# Patient Record
Sex: Female | Born: 2000 | Race: White | Hispanic: No | Marital: Single | Smoking: Never smoker
Health system: Southern US, Community
[De-identification: ages and names within clinical notes are randomized; demographics above are authoritative.]

## PROBLEM LIST (undated history)

## (undated) DIAGNOSIS — N83209 Unspecified ovarian cyst, unspecified side: Secondary | ICD-10-CM

## (undated) DIAGNOSIS — Z789 Other specified health status: Secondary | ICD-10-CM

## (undated) DIAGNOSIS — D1809 Hemangioma of other sites: Secondary | ICD-10-CM

## (undated) HISTORY — PX: NO PAST SURGERIES: SHX2092

## (undated) HISTORY — DX: Other specified health status: Z78.9

## (undated) HISTORY — DX: Unspecified ovarian cyst, unspecified side: N83.209

---
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
## 2022-03-11 NOTE — Progress Notes (Signed)
Carlean Jews, NP   Chief Complaint  Patient presents with   IUD check    No concerns   Urinary Tract Infection    Urinary frequency and burning    HPI:      [name] is a 22 y.o. G0P0000 whose LMP was No LMP recorded. (Menstrual status: IUD)., presents today for IUD check, STD testing. Pt also with UTI sx. Kyleena placed 09/17/21, doing well. Has infrequent spotting with IUD. No dysmen. Doing well.  Pt is sexually active, no pain/bleeding. Neg STD testing 2/23. Pt has noticed urgency, dysuria and frequency with good flow for the past wk. No LBP, hematuria, pelvic pain, vag sx, fevers. Hx of UTIs in past, last one about a yr ago. Has had fatigue and increased appetite with UTI sx, wants preg test.   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 09/01/2020   Encounter for contraceptive management 09/01/2020    Past Surgical History:  Procedure Laterality Date   NO PAST SURGERIES      Family History  Problem Relation Age of Onset   Breast cancer Maternal Aunt 34   Colon cancer Paternal Aunt        18   Colon cancer Paternal Grandmother 20    Social History   Socioeconomic History   Marital status: Single    Spouse name: Not on file   Number of children: Not on file   Years of education: Not on file   Highest education level: Not on file  Occupational History   Not on file  Tobacco Use   Smoking status: Never   Smokeless tobacco: Never  Vaping Use   Vaping Use: Never used  Substance and Sexual Activity   Alcohol use: Never   Drug use: Never   Sexual activity: Yes    Birth control/protection: I.U.D.    Comment: Kyleena  Other Topics Concern   Not on file  Social History Narrative   Not on file   Social Determinants of Health    Financial Resource Strain: Not on file  Food Insecurity: Not on file  Transportation Needs: Not on file  Physical Activity: Not on file  Stress: Not on file  Social Connections: Not on file  Intimate Partner Violence: Not on file    Outpatient Medications Prior to Visit  Medication Sig Dispense Refill   ELIQUIS 2.5 MG TABS tablet Take 2.5 mg by mouth 2 (two) times daily.     gabapentin (NEURONTIN) 300 MG capsule      levonorgestrel (KYLEENA) 19.5 MG IUD 1 each by Intrauterine route once.     albuterol (VENTOLIN HFA) 108 (90 Base) MCG/ACT inhaler Inhale 2 puffs into the lungs every 6 (six) hours as needed for wheezing or shortness of breath. 8 g 2   azithromycin (ZITHROMAX Z-PAK) 250 MG tablet 2 pills today then 1 pill a day for 4 days 6 each 0   brompheniramine-pseudoephedrine-DM 30-2-10 MG/5ML syrup Take 5 mLs by mouth 4 (four) times daily as needed. 250 mL 0   norethindrone (ORTHO MICRONOR) 0.35 MG tablet Take 1 tablet (0.35 mg total) by mouth daily. (Patient not taking: Reported on 09/17/2021) 28 tablet 11   No facility-administered medications prior to visit.      ROS:  Review of Systems  Constitutional:  Negative for fever.  Gastrointestinal:  Negative for blood in stool, constipation, diarrhea, nausea and vomiting.  Genitourinary:  Positive for dysuria, frequency and urgency. Negative for dyspareunia, flank pain, hematuria, vaginal bleeding, vaginal discharge and vaginal pain.  Musculoskeletal:  Negative for back pain.  Skin:  Negative for rash.   BREAST: No symptoms   OBJECTIVE:   Vitals:  BP 110/60   Ht 5\' 7"  (1.702 m)   Wt 128 lb (58.1 kg)   BMI 20.05 kg/m   Physical Exam Vitals reviewed.  Constitutional:      Appearance: She is well-developed.  Pulmonary:     Effort: Pulmonary effort is normal.  Genitourinary:    General: Normal vulva.     Pubic Area: No rash.      Labia:        Right: No rash, tenderness or lesion.        Left: No rash, tenderness  or lesion.      Vagina: Normal. No vaginal discharge, erythema, tenderness or bleeding.     Cervix: Normal.     Uterus: Normal. Not enlarged and not tender.      Adnexa: Right adnexa normal and left adnexa normal.       Right: No mass or tenderness.         Left: No mass or tenderness.       Comments: IUD STRINGS IN CX OS Musculoskeletal:        General: Normal range of motion.     Cervical back: Normal range of motion.  Skin:    General: Skin is warm and dry.  Neurological:     General: No focal deficit present.     Mental Status: She is alert and oriented to person, place, and time.  Psychiatric:        Mood and Affect: Mood normal.        Behavior: Behavior normal.        Thought Content: Thought content normal.        Judgment: Judgment normal.     Results: Results for orders placed or performed in visit on 03/14/22 (from the past 24 hour(s))  POCT urine pregnancy     Status: Normal   Collection Time: 03/14/22 12:39 PM  Result Value Ref Range   Preg Test, Ur Negative Negative  POCT Urinalysis Dipstick     Status: Abnormal   Collection Time: 03/14/22 12:40 PM  Result Value Ref Range   Color, UA amber    Clarity, UA cloudy    Glucose, UA Negative Negative   Bilirubin, UA neg    Ketones, UA neg    Spec Grav, UA 1.025 1.010 - 1.025   Blood, UA small    pH, UA 6.0 5.0 - 8.0   Protein, UA Negative Negative   Urobilinogen, UA     Nitrite, UA neg    Leukocytes, UA Moderate (2+) (A) Negative   Appearance     Odor       Assessment/Plan: Acute cystitis with hematuria - Plan: nitrofurantoin, macrocrystal-monohydrate, (MACROBID) 100 MG capsule, POCT Urinalysis Dipstick, Urine Culture; pos sx and UA. Rx macrobid, check C&S. F/u prn.   Encounter for routine checking of intrauterine contraceptive device (IUD)--IUD strings in cx os, has 5 yr indication  Screening for STD (sexually transmitted disease) - Plan: Cervicovaginal ancillary only  Amenorrhea - Plan: POCT urine  pregnancy; neg UPT. Reassurance. F/u prn.    Meds ordered this encounter  Medications  nitrofurantoin, macrocrystal-monohydrate, (MACROBID) 100 MG capsule    Sig: Take 1 capsule (100 mg total) by mouth 2 (two) times daily for 5 days.    Dispense:  10 capsule    Refill:  0    Order Specific Question:   Supervising Provider    Answer:   Hildred Laser [[id number]]      Return in about 1 year (around 03/15/2023) for annual.  Burney Calzadilla B. Linlee Cromie, PA-C 03/14/2022 12:41 PM

## 2022-03-14 ENCOUNTER — Encounter: Payer: Self-pay | Admitting: Obstetrics and Gynecology

## 2022-03-14 ENCOUNTER — Ambulatory Visit (INDEPENDENT_AMBULATORY_CARE_PROVIDER_SITE_OTHER): Admitting: Obstetrics and Gynecology

## 2022-03-14 ENCOUNTER — Other Ambulatory Visit (HOSPITAL_COMMUNITY)
Admission: RE | Admit: 2022-03-14 | Discharge: 2022-03-14 | Disposition: A | Discharge status: Home / Self Care | Source: Ambulatory Visit | Attending: Obstetrics and Gynecology | Admitting: Obstetrics and Gynecology

## 2022-03-14 VITALS — BP 110/60 | Ht 67.0 in | Wt 128.0 lb

## 2022-03-14 DIAGNOSIS — N3001 Acute cystitis with hematuria: Secondary | ICD-10-CM

## 2022-03-14 DIAGNOSIS — N912 Amenorrhea, unspecified: Secondary | ICD-10-CM

## 2022-03-14 DIAGNOSIS — Z30431 Encounter for routine checking of intrauterine contraceptive device: Secondary | ICD-10-CM

## 2022-03-14 DIAGNOSIS — Z3202 Encounter for pregnancy test, result negative: Secondary | ICD-10-CM | POA: Diagnosis not present

## 2022-03-14 DIAGNOSIS — Z113 Encounter for screening for infections with a predominantly sexual mode of transmission: Secondary | ICD-10-CM | POA: Diagnosis present

## 2022-03-14 LAB — POCT URINALYSIS DIPSTICK
Bilirubin, UA: NEGATIVE
Glucose, UA: NEGATIVE
Ketones, UA: NEGATIVE
Nitrite, UA: NEGATIVE
Protein, UA: NEGATIVE
Spec Grav, UA: 1.025 (ref 1.010–1.025)
pH, UA: 6 (ref 5.0–8.0)

## 2022-03-14 LAB — POCT URINE PREGNANCY: Preg Test, Ur: NEGATIVE

## 2022-03-14 MED ORDER — NITROFURANTOIN MONOHYD MACRO 100 MG PO CAPS: 100.0000 mg | ORAL_CAPSULE | Freq: Two times a day (BID) | ORAL | 0 refills | Status: AC

## 2022-03-14 NOTE — Patient Instructions (Signed)
I value your feedback and you entrusting us with your care. If you get a [facility] patient survey, I would appreciate you taking the time to let us know about your experience today. Thank you! ? ? ?

## 2022-03-15 LAB — CERVICOVAGINAL ANCILLARY ONLY
Chlamydia: NEGATIVE
Comment: NEGATIVE
Comment: NORMAL
Neisseria Gonorrhea: NEGATIVE

## 2022-03-16 LAB — URINE CULTURE

## 2023-09-29 IMAGING — MR MR LUMBAR SPINE WO/W CM
5 of 7 series · 33 of 48 positions shown · IV contrast (gadavist)
Comparison: None

CLINICAL DATA: Low back and bilateral lower extremity pain and
numbness. History of hemangioma in the lumbar area.

EXAM:
MRI LUMBAR SPINE WITHOUT AND WITH CONTRAST
TECHNIQUE: Multiplanar and multiecho pulse sequences of the lumbar spine were
obtained without and with intravenous contrast.
CONTRAST:  6mL GADAVIST GADOBUTROL 1 MMOL/ML IV SOLN

[Series 5: T2 · sagittal · 4.0mm · 0.81mm/px · 3 of 17 slices shown (1 of 2)]
[im 1/17]
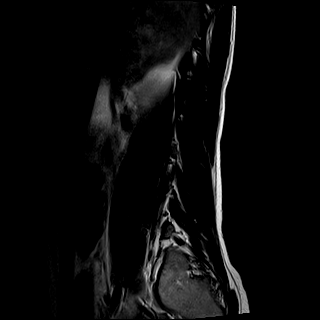
[im 9/17]
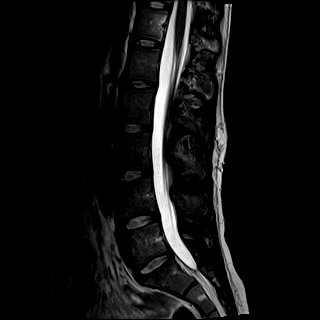
[im 17/17]
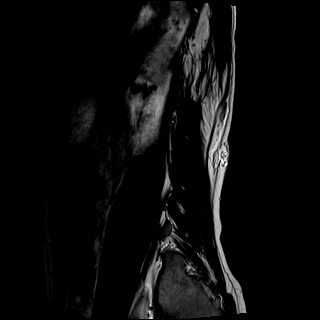

[Series 6: T1 · sagittal · 4.0mm · 0.81mm/px · 4 of 17 slices shown (1 of 2)]
[im 1/17]
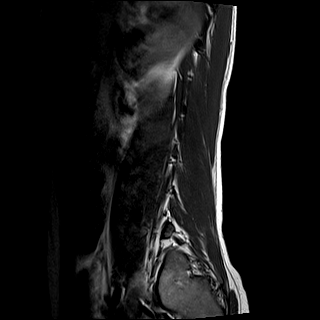
[im 6/17]
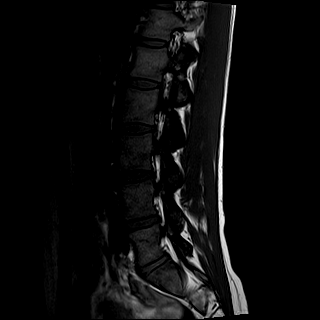
[im 11/17]
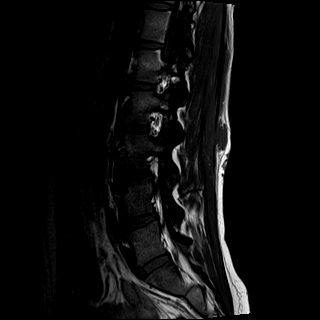
[im 17/17]
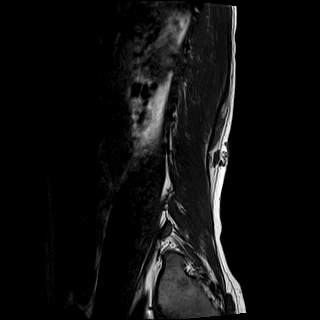

[Series 8: T2 · axial · 4.0mm · 0.78mm/px · z∈[-129,+134]mm · 11 of 45 slices shown (2 of 2)]
[im 1/45]
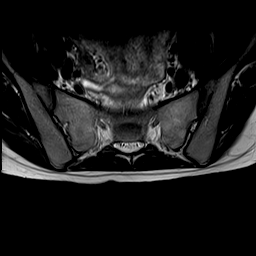
[im 5/45]
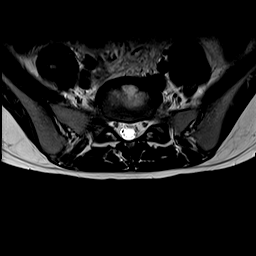
[im 9/45]
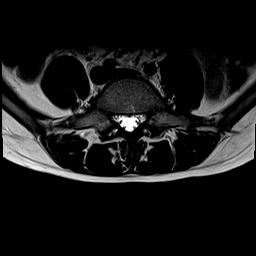
[im 14/45]
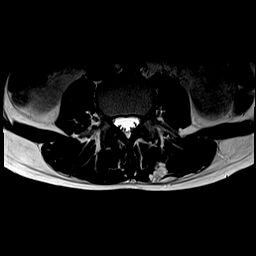
[im 18/45]
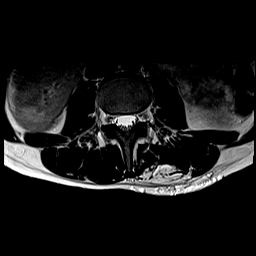
[im 23/45]
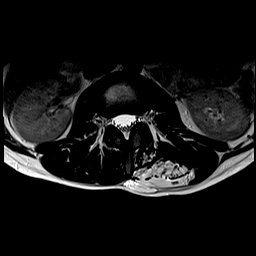
[im 27/45]
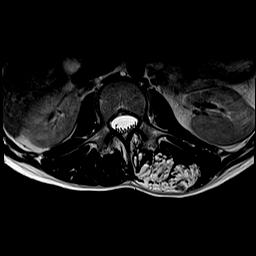
[im 31/45]
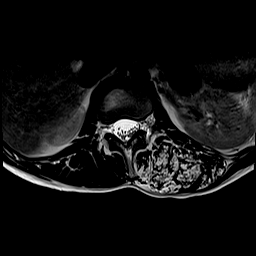
[im 36/45]
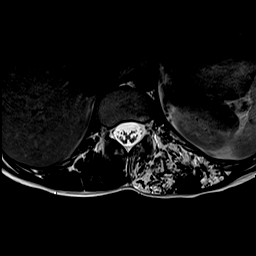
[im 40/45]
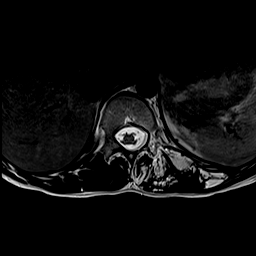
[im 45/45]
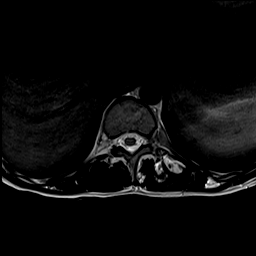

[Series 9: T1 · axial · 4.0mm · 0.39mm/px · z∈[-129,+134]mm · 11 of 45 slices shown (2 of 2)]
[im 1/45]
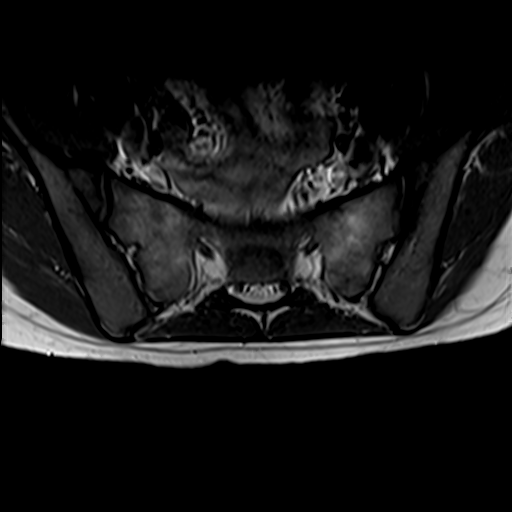
[im 5/45]
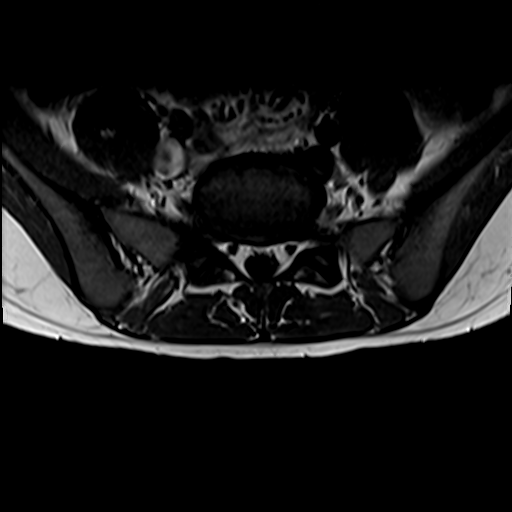
[im 9/45]
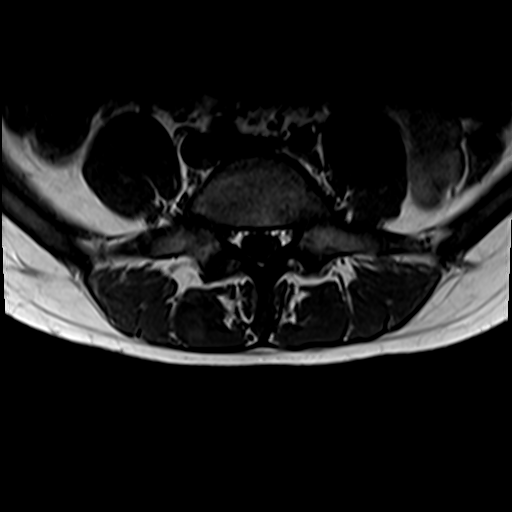
[im 14/45]
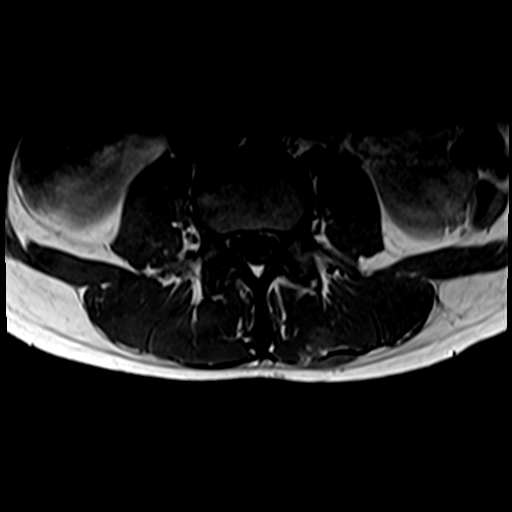
[im 18/45]
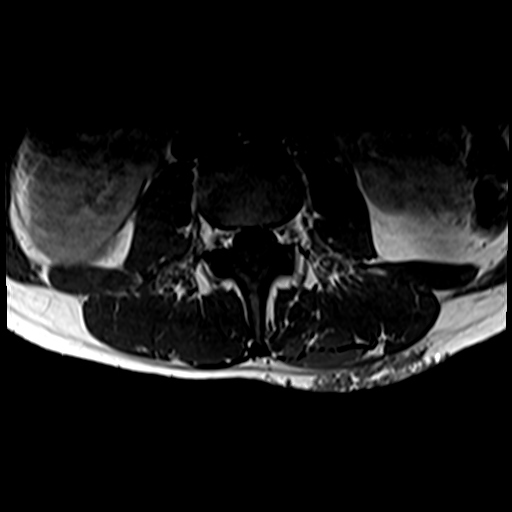
[im 23/45]
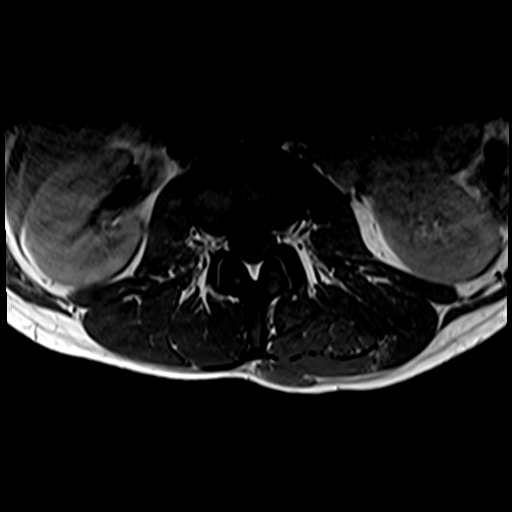
[im 27/45]
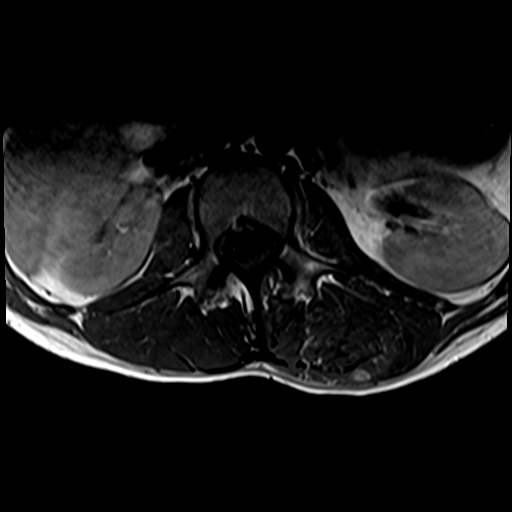
[im 31/45]
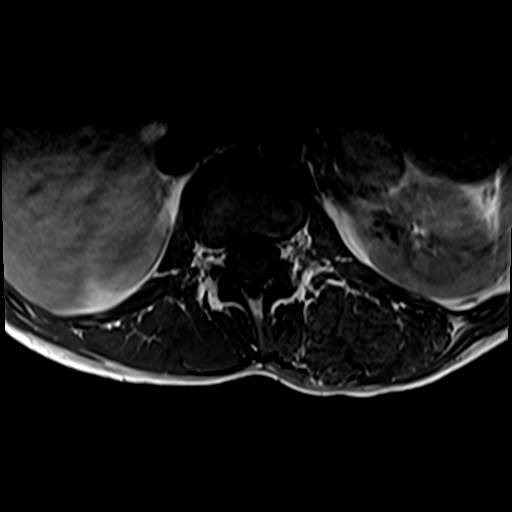
[im 36/45]
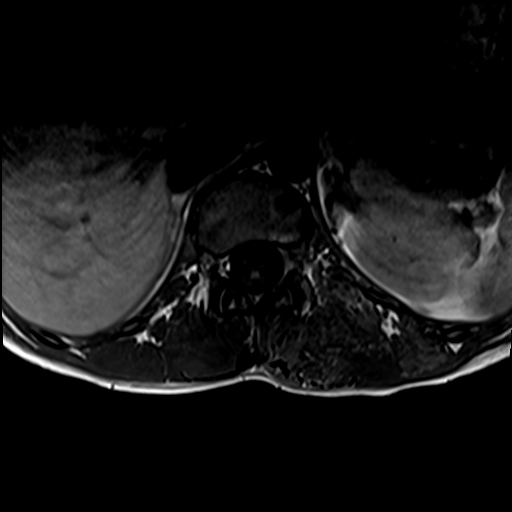
[im 40/45]
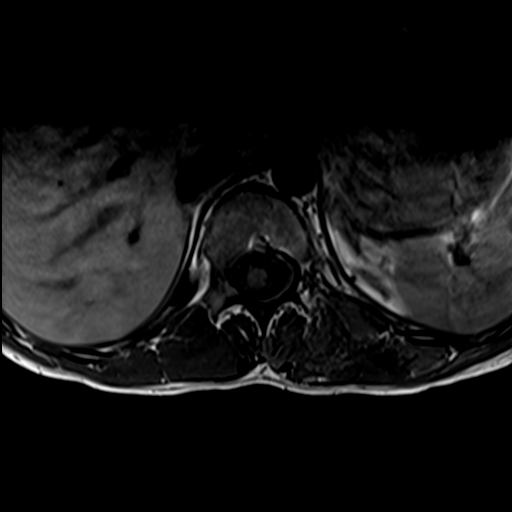
[im 45/45]
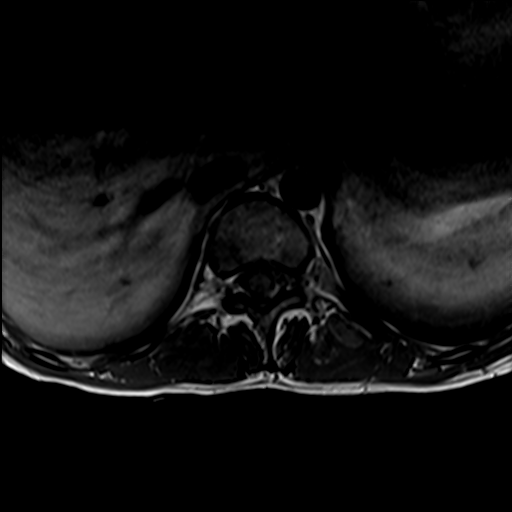

[Series 10: T1 fat-sat post-contrast · sagittal · 4.0mm · 0.81mm/px · 4 of 17 slices shown]
[im 1/17]
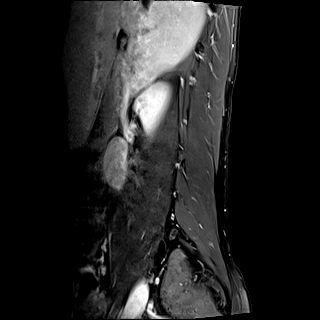
[im 6/17]
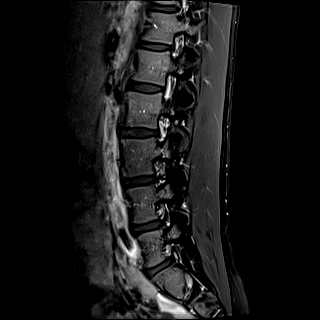
[im 11/17]
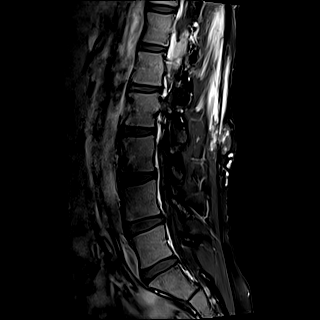
[im 17/17]
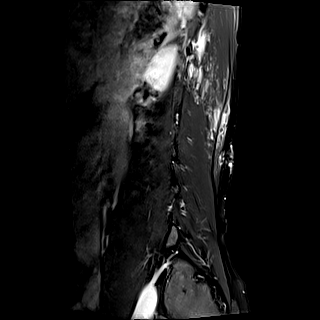

[33 of 48 positions shown; findings below may reference images not displayed]

FINDINGS: Segmentation: There are five lumbar type vertebral bodies. The last
full intervertebral disc space is labeled L5-S1.

Alignment:  Normal

Vertebrae: There is osseous involvement in the lower thoracic and
upper lumbar spine from the large hemangioma. This mainly involves
the spinous processes of T12 and L1. The left T12 pedicle is also
involved and the left lamina at T12.

Conus medullaris and cauda equina: Conus extends to the T12-L1
level. Conus and cauda equina appear normal. No findings suspicious
for involvement of the cauda equina.

Paraspinal and other soft tissues: Large heterogeneous infiltrating
lesion (hemangioma) centered in the left paravertebral musculature.
The lesion extends from at least T11-L4.

Disc levels:

No disc protrusions, spinal or foraminal stenosis. No direct spinal
canal involvement is identified. There are some prominent vessels in
the region of the T12-L1 neural foramen on the left.
IMPRESSION: 1. Large infiltrating hemangioma in the left paravertebral
musculature. There is osseous involvement as detailed above.
2. No MR findings to suggest spinal canal invasion or involvement of
the cauda equina.
3. Probable involvement of the left T12-L1 neural foramen with
prominent vessels.
4. No lumbar disc protrusions, spinal or foraminal stenosis.

## 2023-12-01 IMAGING — CT CT ABD-PELV W/ CM
2 of 4 series · 15 of 46 positions shown, 17 images · IV contrast (APPLIED)
Comparison: None Available.

CLINICAL DATA: Abdominal pain with nausea and vomiting

EXAM:
CT ABDOMEN AND PELVIS WITH CONTRAST
TECHNIQUE: Multidetector CT imaging of the abdomen and pelvis was performed
using the standard protocol following bolus administration of
intravenous contrast.

[Series 2: abdomen 5.0 · axial · 0.75mm/px · z∈[-1126,-671]mm · 12 of 103 slices shown, 14 images]
[im 6/103  soft-tissue]
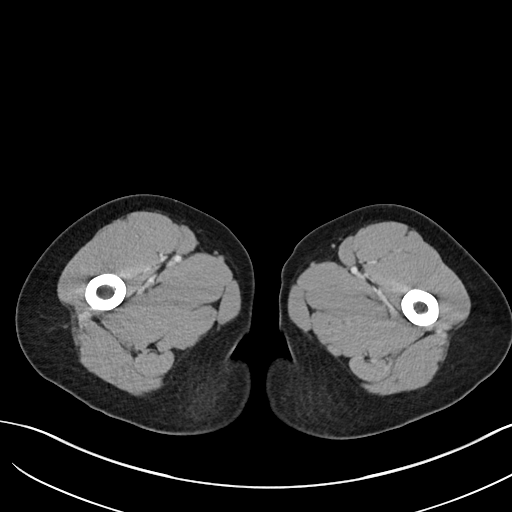
[im 6/103  bone]
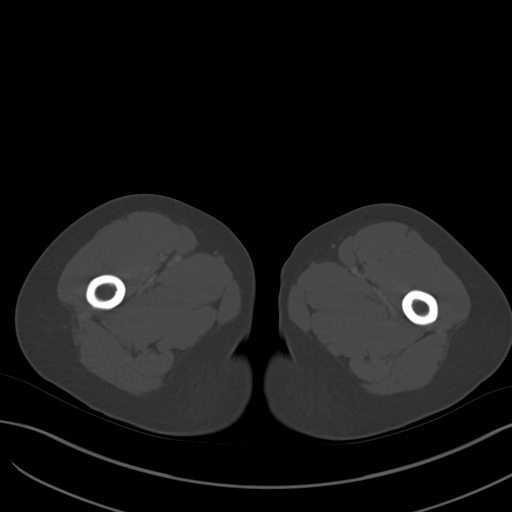
[im 17/103  soft-tissue]
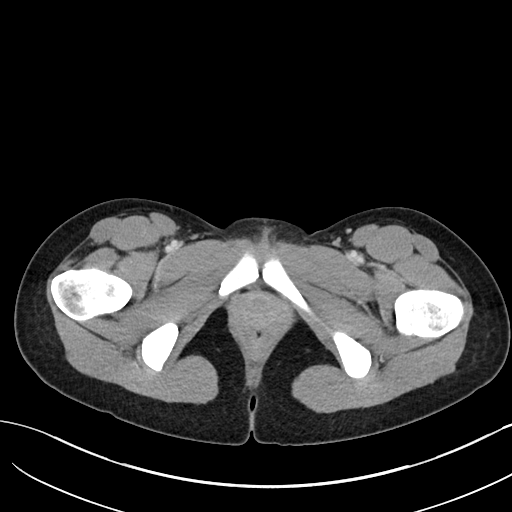
[im 22/103  soft-tissue]
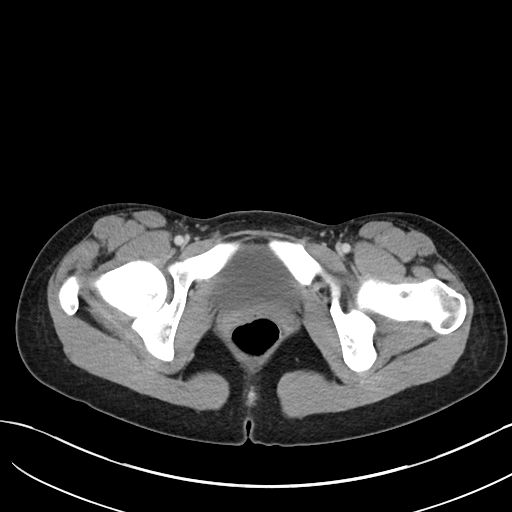
[im 33/103  soft-tissue]
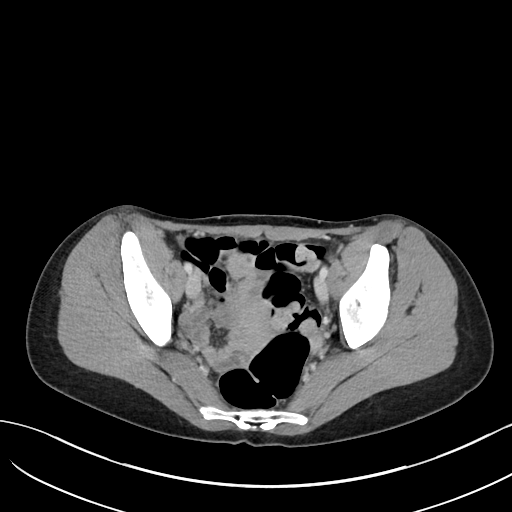
[im 38/103  soft-tissue]
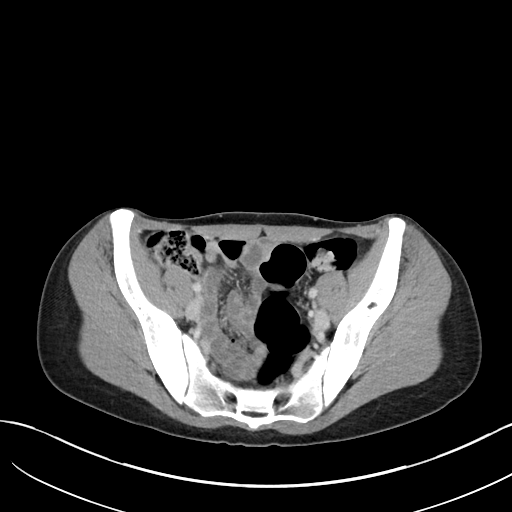
[im 49/103  soft-tissue]
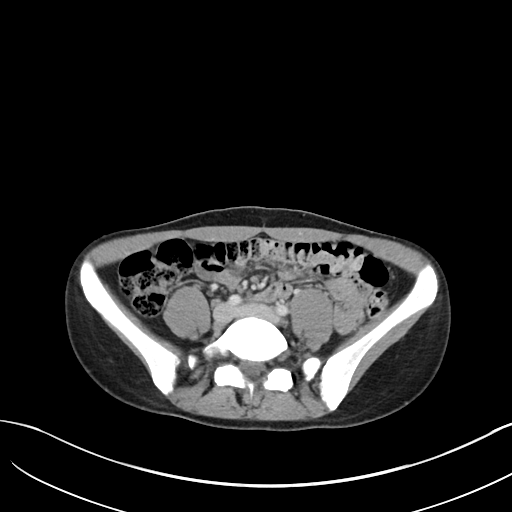
[im 54/103  soft-tissue]
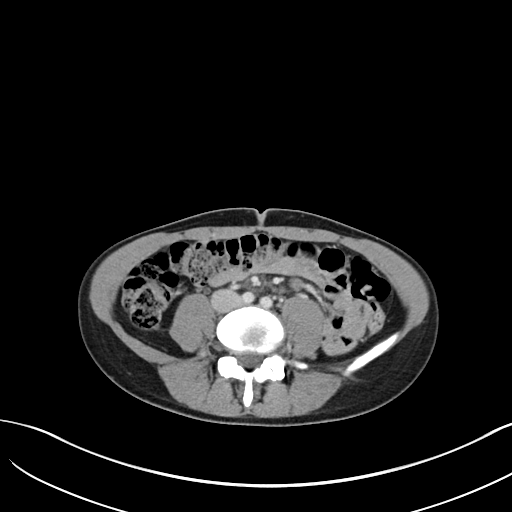
[im 65/103  soft-tissue]
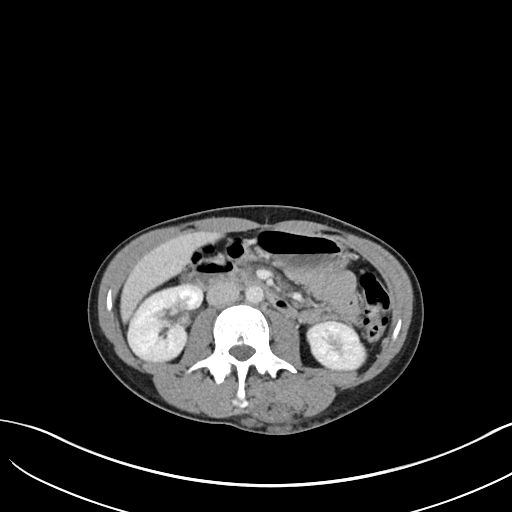
[im 70/103  soft-tissue]
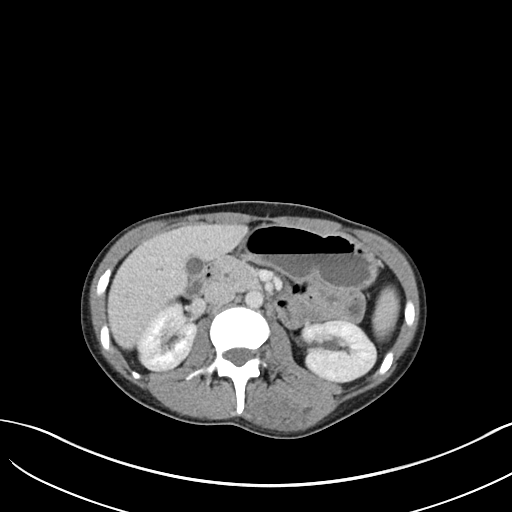
[im 70/103  bone]
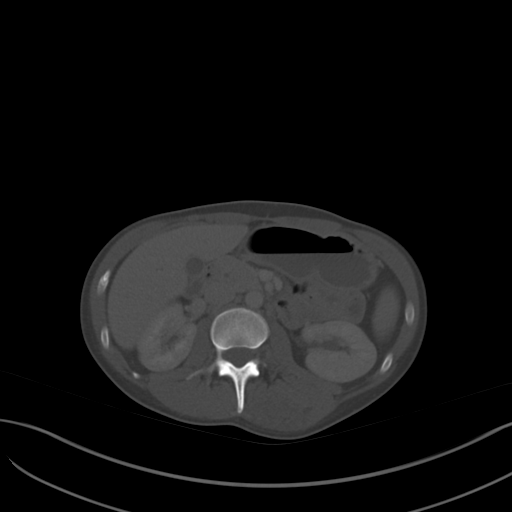
[im 81/103  soft-tissue]
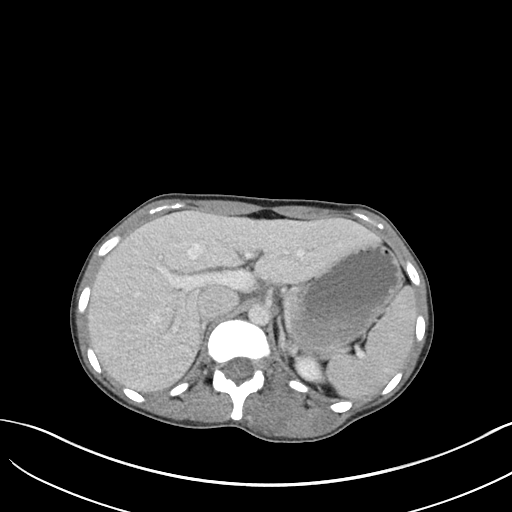
[im 86/103  soft-tissue]
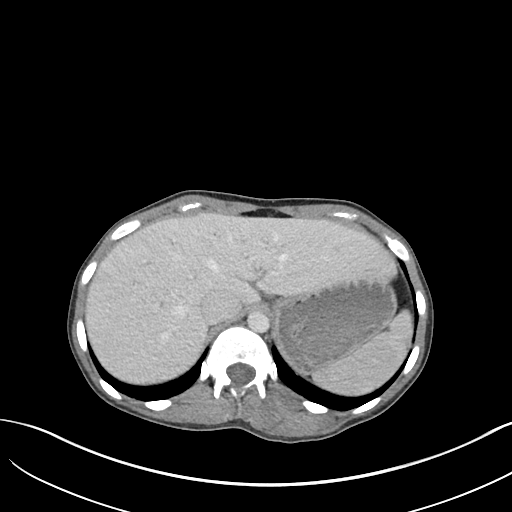
[im 97/103  soft-tissue]
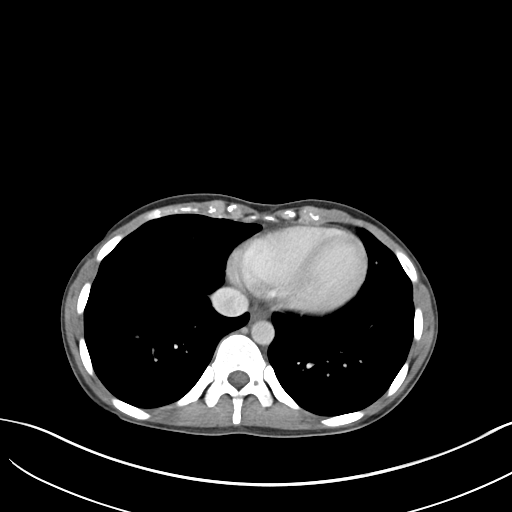

[Series 5: abdomen 3.0 mpr cor · coronal · 0.74mm/px · 3 of 59 slices shown]
[im 20/59  soft-tissue]
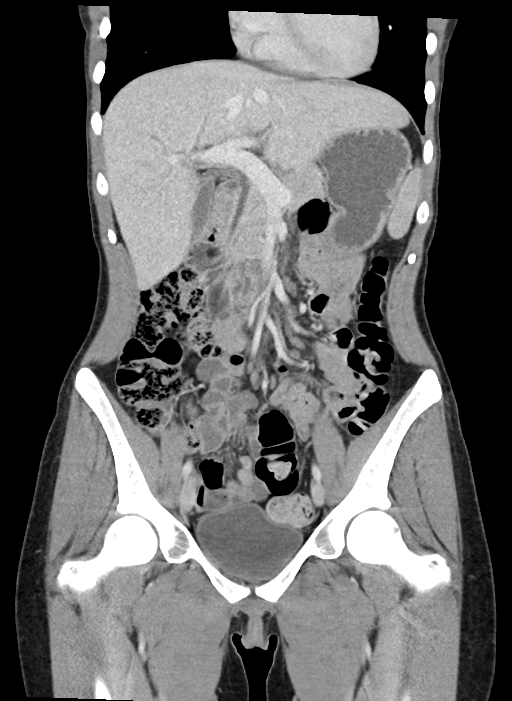
[im 26/59  soft-tissue]
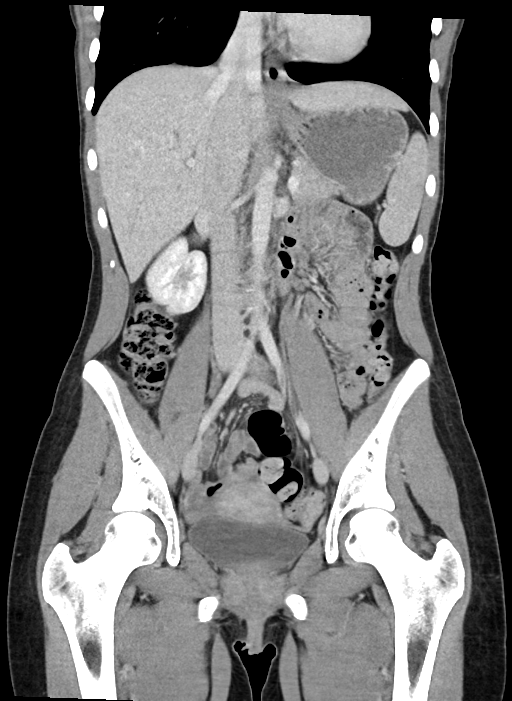
[im 33/59  soft-tissue]
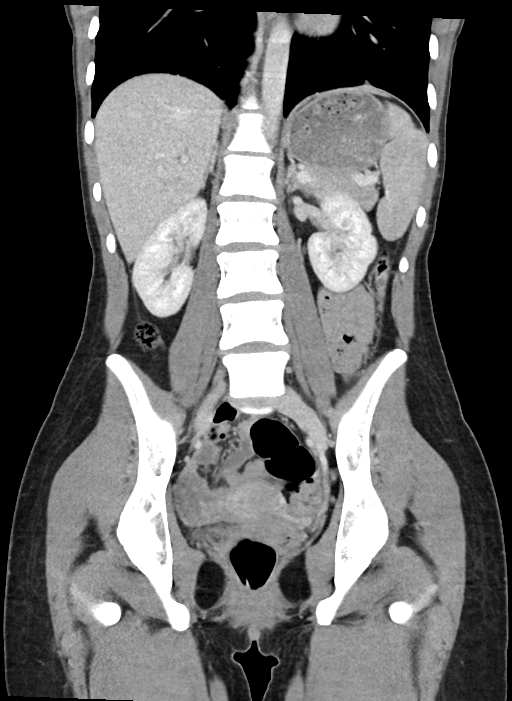

[15 of 46 positions shown; findings below may reference images not displayed]

RADIATION DOSE REDUCTION: This exam was performed according to the
departmental dose-optimization program which includes automated
exposure control, adjustment of the mA and/or kV according to
patient size and/or use of iterative reconstruction technique.

CONTRAST:  75mL OMNIPAQUE IOHEXOL 300 MG/ML  SOLN
FINDINGS: Lower chest: No acute abnormality.

Hepatobiliary: No focal liver abnormality is seen. No gallstones,
gallbladder wall thickening, or biliary dilatation.

Pancreas: Unremarkable. No pancreatic ductal dilatation or
surrounding inflammatory changes.

Spleen: Normal in size without focal abnormality.

Adrenals/Urinary Tract: Adrenal glands are within normal limits.
Kidneys demonstrate a normal enhancement pattern bilaterally. No
renal calculi or obstructive changes are seen. The bladder is
partially distended.

Stomach/Bowel: No obstructive or inflammatory changes of the colon
are seen. The appendix is partially visualized. No inflammatory
changes are seen. Small bowel and stomach are unremarkable.

Vascular/Lymphatic: No significant vascular findings are present. No
enlarged abdominal or pelvic lymph nodes.

Reproductive: Uterus is within normal limits. A 2.2 cm left ovarian
simple cyst is noted.

Other: No abdominal wall hernia or abnormality. No abdominopelvic
ascites.

Musculoskeletal: No acute or significant osseous findings. Mild
prominence of the left paraspinal muscles is noted posteriorly with
associated serpiginous densities in the subcutaneous soft tissues
consistent with the known history of cutaneous hemangioma.
IMPRESSION: No acute abnormality noted.

Changes consistent with the known history of cutaneous hemangioma.

2.2 cm left ovarian simple-appearing cyst. No follow-up imaging is
recommended.

Reference: JACR [DATE]):[id number]

## 2023-12-01 IMAGING — US US PELVIS COMPLETE TRANSABD/TRANSVAG W DUPLEX AND/OR DOPPLER
1 series · 13 of 25 positions shown · non-contrast
Comparison: None Available.

CLINICAL DATA: 20-year-old female with history of lower abdominal
pain for 1 day.

EXAM:
TRANSABDOMINAL AND TRANSVAGINAL ULTRASOUND OF PELVIS
DOPPLER ULTRASOUND OF OVARIES
TECHNIQUE: Both transabdominal and transvaginal ultrasound examinations of the
pelvis were performed. Transabdominal technique was performed for
global imaging of the pelvis including uterus, ovaries, adnexal
regions, and pelvic cul-de-sac.
It was necessary to proceed with endovaginal exam following the
transabdominal exam to visualize the adnexal regions. Color and
duplex Doppler ultrasound was utilized to evaluate blood flow to the
ovaries.

[Series 1: us pelvic complete w transvaginal and torsion righ · 89 acquisitions, 13 frames shown]
[im 1/89]
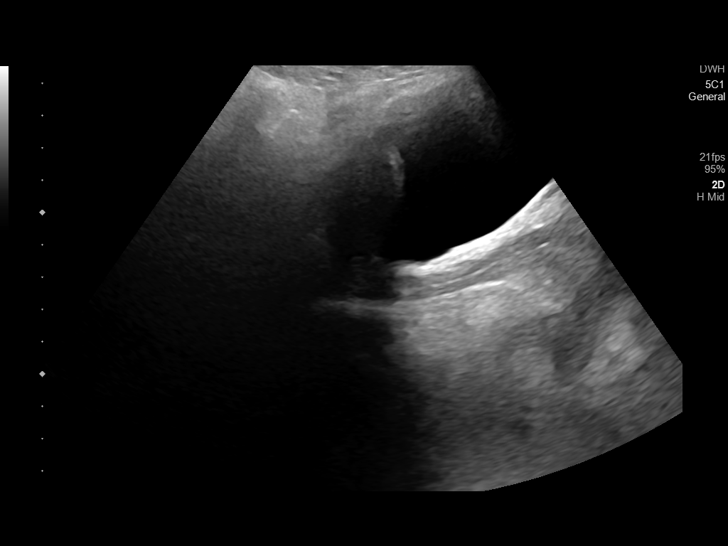
[im 8/89]
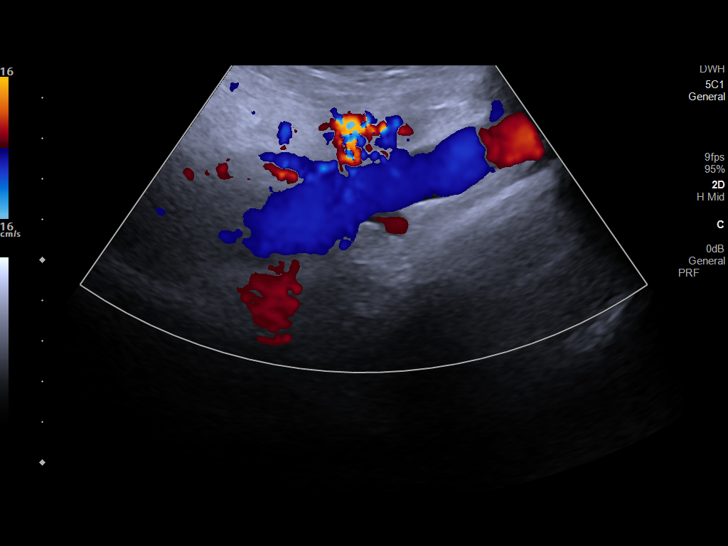
[im 15/89]
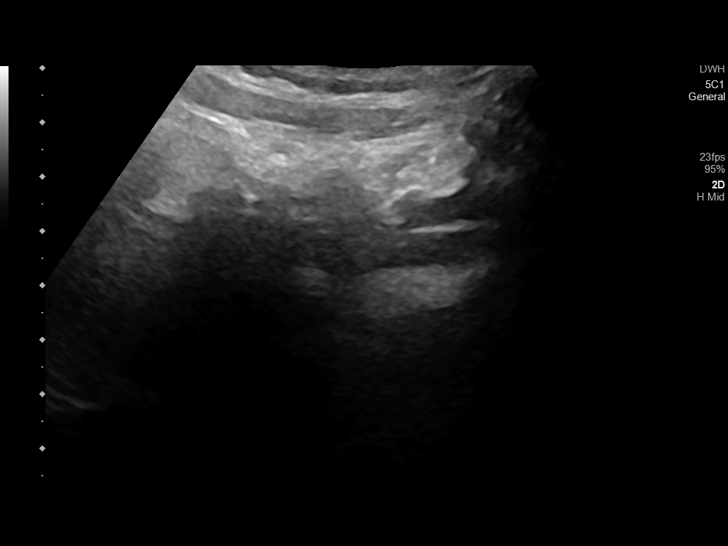
[im 23/89]
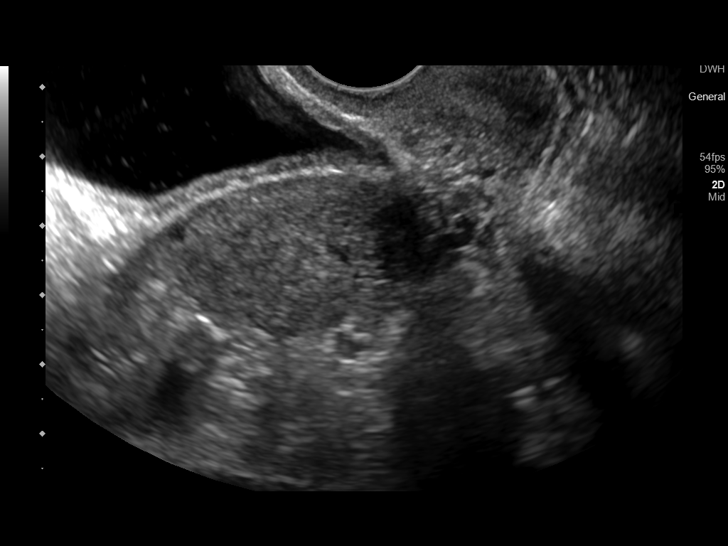
[im 30/89]
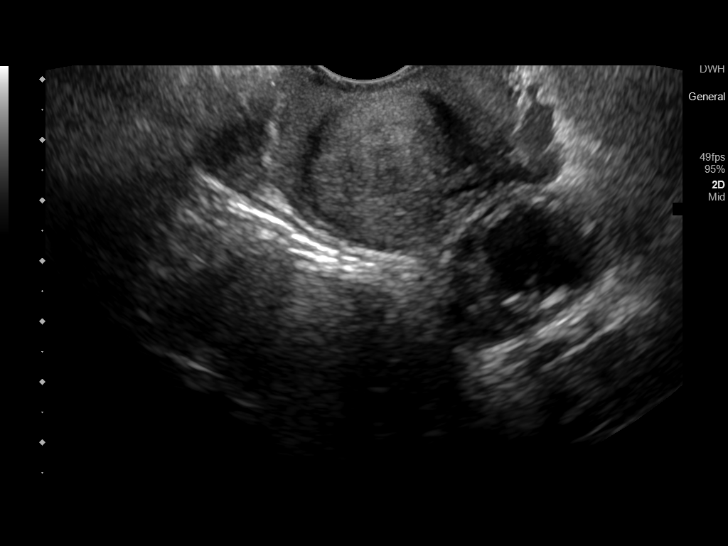
[im 37/89]
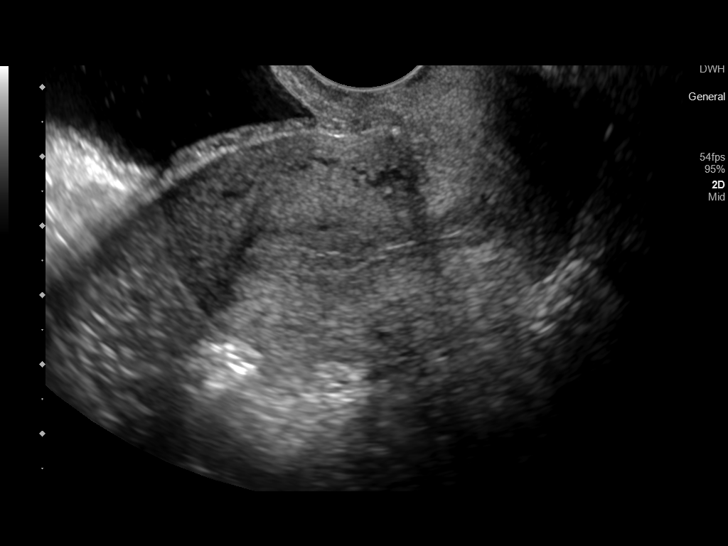
[im 45/89]
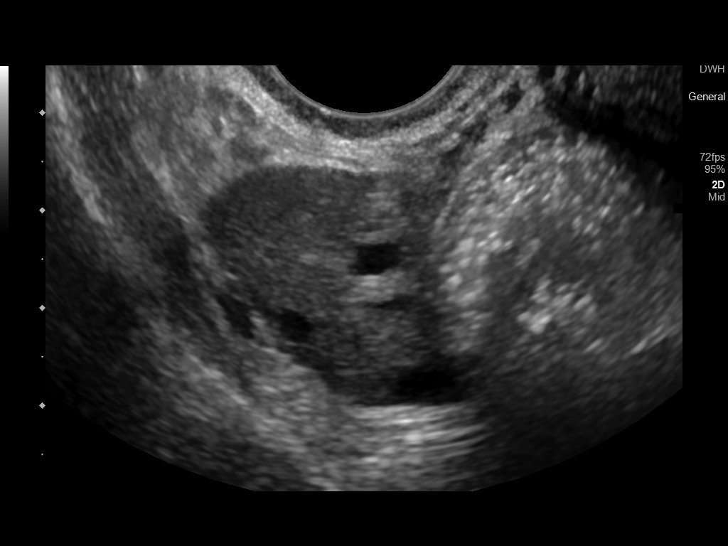
[im 52/89]
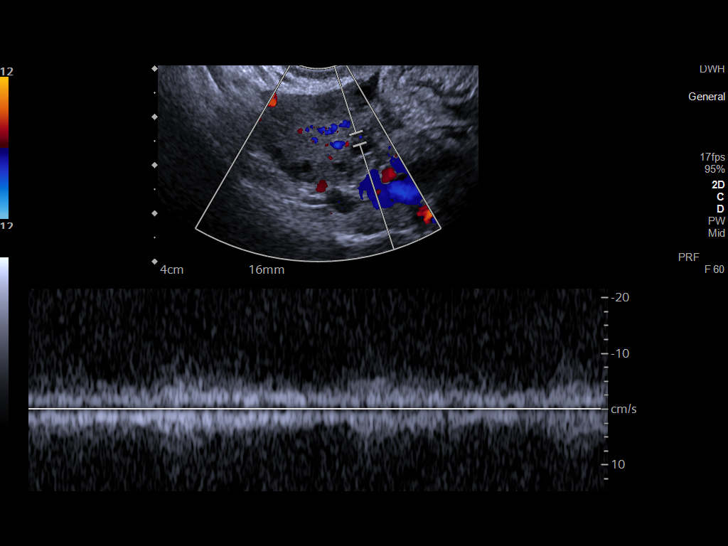
[im 59/89]
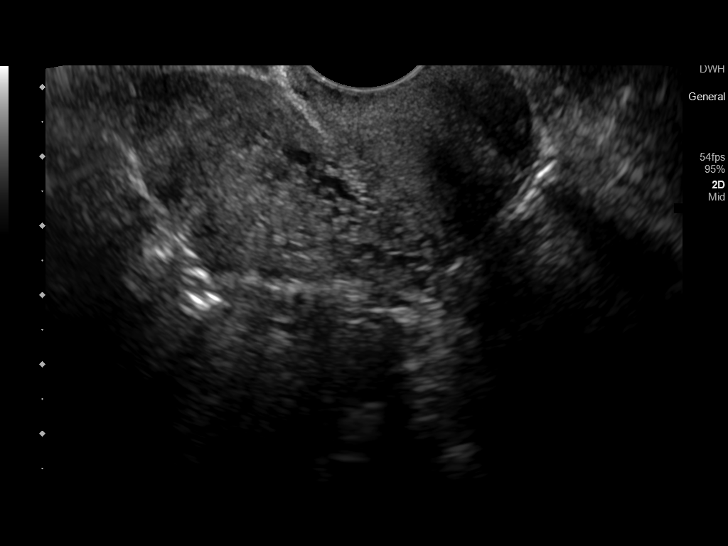
[im 67/89]
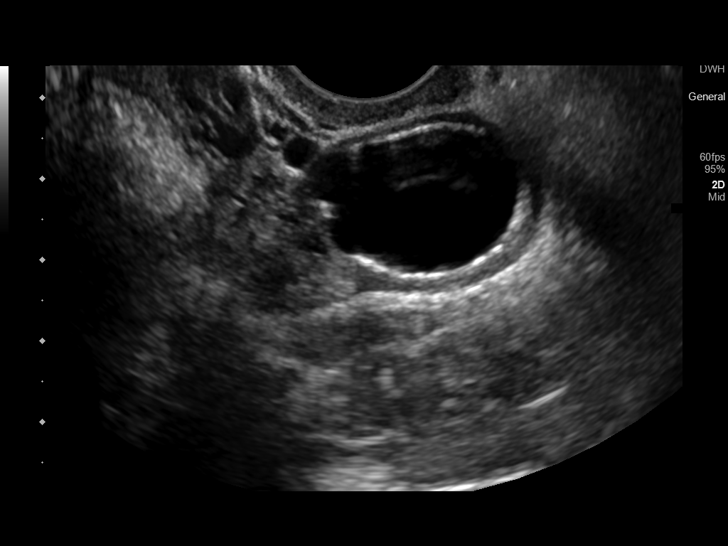
[im 74/89]
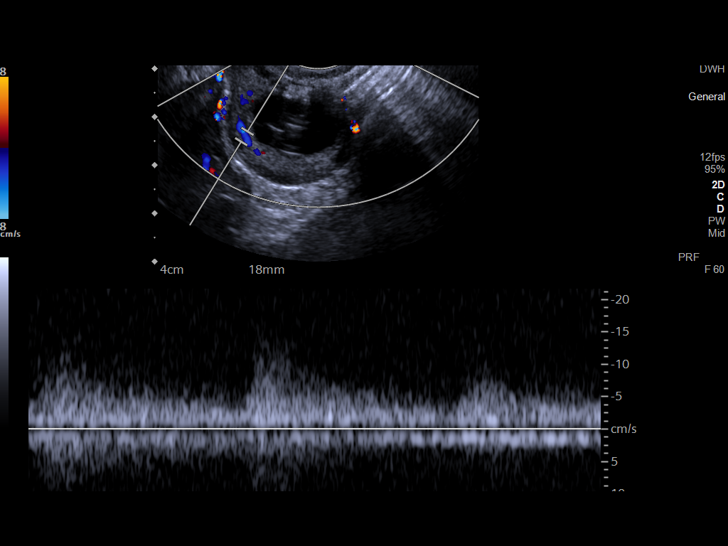
[im 81/89]
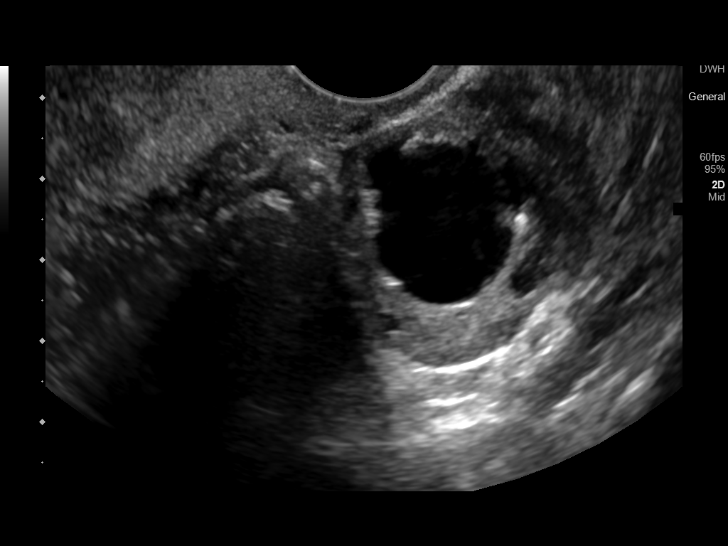
[im 89/89]
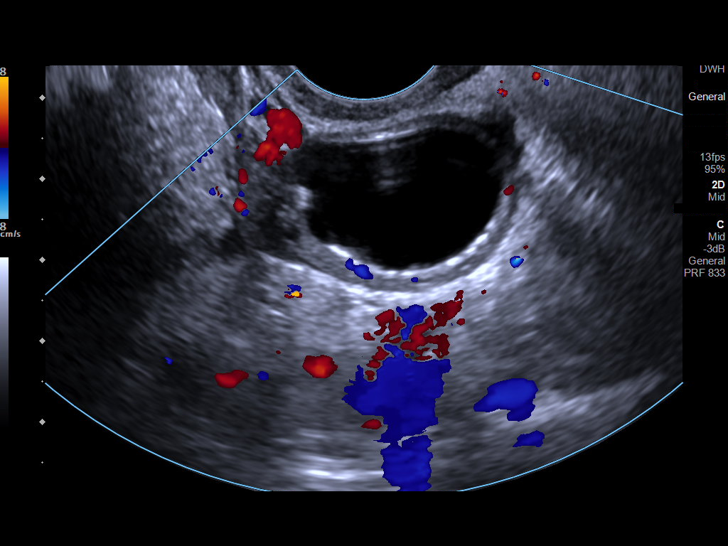

[13 of 25 positions shown; findings below may reference images not displayed]

FINDINGS: Uterus

Measurements: 6.7 x 3.4 x 4.9 cm = volume: 57.5 mL. No fibroids or
other mass visualized.

Endometrium

Thickness: 6.5 mm.  No focal abnormality visualized.

Right ovary

Measurements: 3.1 x 2.3 x 2.4 cm = volume: 8.8 mL. Normal
appearance/no adnexal mass.

Left ovary

Measurements: 3.8 x 2.6 x 2.8 cm = volume: 14.8 mL. In the left
ovary there is a well-defined 2.0 x 2.6 x 2.4 cm anechoic lesion
with increased through transmission with slightly echogenic borders,
most compatible with a degenerating corpus luteum cyst.

Pulsed Doppler evaluation of both ovaries demonstrates normal
low-resistance arterial and venous waveforms.

Other findings

No abnormal free fluid.
IMPRESSION: 1. No acute findings are noted.
2. Degenerating corpus luteum cyst in the left ovary incidentally
noted.
